# Patient Record
Sex: Male | Born: 2000 | Race: White | Hispanic: No | Marital: Single | Smoking: Never smoker
Health system: Southern US, Community
[De-identification: ages and names within clinical notes are randomized; demographics above are authoritative.]

---
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
## 2022-06-13 ENCOUNTER — Ambulatory Visit
Admission: EM | Admit: 2022-06-13 | Discharge: 2022-06-13 | Disposition: A | Discharge status: Home / Self Care | Payer: BC Managed Care – PPO | Attending: Physician Assistant | Admitting: Physician Assistant

## 2022-06-13 DIAGNOSIS — S0501XA Injury of conjunctiva and corneal abrasion without foreign body, right eye, initial encounter: Secondary | ICD-10-CM

## 2022-06-13 MED ORDER — KETOROLAC TROMETHAMINE 0.5 % OP SOLN: 1.0000 [drp] | Freq: Four times a day (QID) | OPHTHALMIC | 0 refills | Status: AC | PRN

## 2022-06-13 MED ORDER — ERYTHROMYCIN 5 MG/GM OP OINT: TOPICAL_OINTMENT | OPHTHALMIC | 0 refills | Status: AC

## 2022-06-13 NOTE — Discharge Instructions (Signed)
-  You have multiple corneal abrasions or scratches.  I do not see any foreign bodies.  I have sent an antibiotic ointment and a anti-inflammatory eyedrop to the pharmacy but you should still contact [facility] on Monday for an appointment as this has been going on for a couple of weeks without improvement.  [facility] Address: [address] [place], [place] [address] Phone: [phone]

## 2022-06-13 NOTE — ED Triage Notes (Signed)
Pt c/o right eye swelling and pink x1.5weeks.  Pt believes that insulation was blown into his eye from work.  Pt states that he is having light sensitivity.

## 2022-06-13 NOTE — ED Provider Notes (Signed)
[facility]    CSN: [id number] Arrival date & time: 06/13/22  1217      History   Chief Complaint Chief Complaint  Patient presents with   Eye Problem    HPI Ernest Guzman is a 21 y.o. male presenting for 1-1/2-week history of right eye redness and watery drainage.  He also reports photophobia.  Admits to some pain this this is not significant.  No improvement in symptoms from the onset.  He does state that symptoms started after a possible insulation blew into his eye.  He says he tries to rinse the eye out.  He has been using over-the-counter redness relieving eyedrops without improvement in symptoms.  He denies any headaches, vision changes, vomiting.  Does not wear contacts or glasses.  Does not have an eye doctor.  HPI  History reviewed. No pertinent past medical history.  There are no problems to display for this patient.   History reviewed. No pertinent surgical history.     Home Medications    Prior to Admission medications   Medication Sig Start Date End Date Taking? Authorizing Provider  erythromycin ophthalmic ointment Place a 1/2 inch ribbon of ointment into the lower eyelid q6h 06/13/22 06/20/22 Yes Eusebio Friendly B, PA-C  ketorolac (ACULAR) 0.5 % ophthalmic solution Place 1 drop into both eyes 4 (four) times daily as needed. 06/13/22  Yes Shirlee Latch PA-C    Family History History reviewed. No pertinent family history.  Social History Social History   Tobacco Use   Smoking status: Never   Smokeless tobacco: Never  Vaping Use   Vaping Use: Never used  Substance Use Topics   Alcohol use: Never   Drug use: Never     Allergies   Patient has no allergy information on record.   Review of Systems Review of Systems  HENT:  Negative for congestion and facial swelling.   Eyes:  Positive for photophobia, pain, redness and visual disturbance. Negative for discharge.  Respiratory:  Negative for cough.   Neurological:  Negative for  dizziness and headaches.     Physical Exam Triage Vital Signs ED Triage Vitals  Enc Vitals Group     BP 06/13/22 1238 118/71     Pulse Rate 06/13/22 1238 64     Resp 06/13/22 1238 20     Temp 06/13/22 1238 98.3 F (36.8 C)     Temp Source 06/13/22 1238 Oral     SpO2 06/13/22 1238 100 %     Weight 06/13/22 1237 180 lb (81.6 kg)     Height 06/13/22 1237 5\' 11"  (1.803 m)     Head Circumference --      Peak Flow --      Pain Score 06/13/22 1237 0     Pain Loc --      Pain Edu? --      Excl. in GC? --    No data found.  Updated Vital Signs BP 118/71 (BP Location: Left Arm)   Pulse 64   Temp 98.3 F (36.8 C) (Oral)   Resp 20   Ht 5\' 11"  (1.803 m)   Wt 180 lb (81.6 kg)   SpO2 100%   BMI 25.10 kg/m   Visual Acuity Right Eye Distance: 40 Left Eye Distance: 25 Bilateral Distance: 20  Right Eye Near:   Left Eye Near:    Bilateral Near:   (Pt does not wear contacts or glasses)  Physical Exam Vitals and nursing note reviewed.  Constitutional:  General: He is not in acute distress.    Appearance: Normal appearance. He is well-developed. He is not ill-appearing.  HENT:     Head: Normocephalic and atraumatic.  Eyes:     General: Lids are normal. Lids are everted, no foreign bodies appreciated. No scleral icterus.    Conjunctiva/sclera:     Right eye: Right conjunctiva is injected.     Pupils: Pupils are equal, round, and reactive to light.     Right eye: Corneal abrasion present.      Comments: Positive clear watery drainage from right eye  Multiple corneal abrasions as shown in the picture above and red.  Cardiovascular:     Rate and Rhythm: Normal rate and regular rhythm.  Pulmonary:     Effort: Pulmonary effort is normal. No respiratory distress.  Musculoskeletal:     Cervical back: Neck supple.  Skin:    General: Skin is warm and dry.     Capillary Refill: Capillary refill takes less than 2 seconds.  Neurological:     General: No focal deficit  present.     Mental Status: He is alert. Mental status is at baseline.     Motor: No weakness.     Gait: Gait normal.  Psychiatric:        Mood and Affect: Mood normal.        Behavior: Behavior normal.      UC Treatments / Results  Labs (all labs ordered are listed, but only abnormal results are displayed) Labs Reviewed - No data to display  EKG   Radiology No results found.  Procedures Procedures (including critical care time)  Medications Ordered in UC Medications - No data to display  Initial Impression / Assessment and Plan / UC Course  I have reviewed the triage vital signs and the nursing notes.  Pertinent labs & imaging results that were available during my care of the patient were reviewed by me and considered in my medical decision making (see chart for details).   21 year old male presenting for right eye redness, drainage after getting insulation blown into his eye.  Symptom onset 1-1/2 weeks ago.  No improvement or worsening of symptoms since then.  No vision change.  Vision is intact.  On exam he does have diffuse conjunctival injection of the right eye.  Under fluorescein stain he has numerous corneal abrasions of the lateral and medial aspects of the conjunctiva.  Advised patient he should follow-up with [facility].  Advised to contact on Monday.  In the meantime, will treat for inflammation and discomfort with ketorolac eye drops and also erythromycin ointment to cover for any infection.  ED precautions given.   Final Clinical Impressions(s) / UC Diagnoses   Final diagnoses:  Abrasion of right cornea, initial encounter     Discharge Instructions      -You have multiple corneal abrasions or scratches.  I do not see any foreign bodies.  I have sent an antibiotic ointment and a anti-inflammatory eyedrop to the pharmacy but you should still contact [facility] on Monday for an appointment as this has been going on for a couple of weeks  without improvement.  [facility] Address: [address], [place], [place] [address] Phone: [phone]     ED Prescriptions     Medication Sig Dispense Auth. Provider   erythromycin ophthalmic ointment Place a 1/2 inch ribbon of ointment into the lower eyelid q6h 3.5 g Eusebio Friendly B, PA-C   ketorolac Berkley Harvey)  0.5 % ophthalmic solution Place 1 drop into both eyes 4 (four) times daily as needed. 5 mL Shirlee Latch, PA-C      PDMP not reviewed this encounter.   Shirlee Latch, PA-C 06/13/22 1431
